# Patient Record
Sex: Female | Born: 1990 | Race: White | Hispanic: No | Marital: Single
Health system: Southern US, Community
[De-identification: ages and names within clinical notes are randomized; demographics above are authoritative.]

---

## 2010-06-23 ENCOUNTER — Observation Stay: Payer: Self-pay | Admitting: Obstetrics & Gynecology

## 2010-07-02 ENCOUNTER — Inpatient Hospital Stay: Payer: Self-pay | Admitting: Obstetrics & Gynecology

## 2013-08-01 ENCOUNTER — Emergency Department: Payer: Self-pay | Admitting: Emergency Medicine

## 2013-08-03 ENCOUNTER — Emergency Department: Payer: Self-pay | Admitting: Emergency Medicine

## 2014-06-26 ENCOUNTER — Ambulatory Visit: Payer: Self-pay | Admitting: Family Medicine

## 2014-08-07 ENCOUNTER — Ambulatory Visit: Payer: Self-pay | Admitting: Family Medicine

## 2014-11-20 ENCOUNTER — Ambulatory Visit: Payer: Self-pay

## 2014-12-04 ENCOUNTER — Ambulatory Visit: Payer: Self-pay | Admitting: Obstetrics & Gynecology

## 2014-12-10 ENCOUNTER — Inpatient Hospital Stay: Payer: Self-pay

## 2014-12-10 LAB — BASIC METABOLIC PANEL
Anion Gap: 6 — ABNORMAL LOW (ref 7–16)
BUN: 10 mg/dL
CO2: 22 mmol/L
Calcium, Total: 8.8 mg/dL — ABNORMAL LOW
Chloride: 106 mmol/L
Creatinine: 0.44 mg/dL
EGFR (Non-African Amer.): >60
Glucose: 85 mg/dL
Potassium: 3.8 mmol/L
Sodium: 134 mmol/L — ABNORMAL LOW

## 2014-12-10 LAB — URIC ACID: Uric Acid: 5.1 mg/dL (ref 2.6–6.0)

## 2014-12-10 LAB — CBC WITH DIFFERENTIAL/PLATELET
BASOS ABS: 0 10*3/uL (ref 0.0–0.1)
Basophil %: 0.2 %
Eosinophil #: 0.1 10*3/uL (ref 0.0–0.7)
Eosinophil %: 0.4 %
HCT: 34.6 % — ABNORMAL LOW (ref 35.0–47.0)
HGB: 10.9 g/dL — ABNORMAL LOW (ref 12.0–16.0)
LYMPHS ABS: 1.7 10*3/uL (ref 1.0–3.6)
Lymphocyte %: 14.8 %
MCH: 25.1 pg — AB (ref 26.0–34.0)
MCHC: 31.6 g/dL — AB (ref 32.0–36.0)
MCV: 80 fL (ref 80–100)
MONO ABS: 0.8 x10 3/mm (ref 0.2–0.9)
Monocyte %: 6.5 %
Neutrophil #: 9 10*3/uL — ABNORMAL HIGH (ref 1.4–6.5)
Neutrophil %: 78.1 %
Platelet: 233 10*3/uL (ref 150–440)
RBC: 4.35 10*6/uL (ref 3.80–5.20)
RDW: 14.1 % (ref 11.5–14.5)
WBC: 11.6 10*3/uL — ABNORMAL HIGH (ref 3.6–11.0)

## 2014-12-10 LAB — PROTEIN / CREATININE RATIO, URINE
Creatinine, Urine: 67 mg/dL (ref 30.0–125.0)
PROTEIN, RANDOM URINE: 101 mg/dL — AB (ref 0–12)
PROTEIN/CREAT. RATIO: 1507 mg/g{creat} — AB (ref 0–200)

## 2014-12-10 LAB — SGOT (AST)(ARMC): SGOT(AST): 26 U/L

## 2014-12-11 LAB — HEMATOCRIT: HCT: 31.3 % — ABNORMAL LOW (ref 35.0–47.0)

## 2014-12-11 LAB — GC/CHLAMYDIA PROBE AMP

## 2015-01-26 NOTE — H&P (Signed)
L&D Evaluation:  History:  HPI 24 year old G2 P1001 with EDC=12/20/2014 by a 14 week [facility] ultrasound who presents to L&D at 38 4/7 weeks with c/o SROM at 0430. Has had contractions all night. No VB. [place] and [facility] 27[facility].PNC remarkable for an elevated one hr GTT and a normal 3hr GTT,  and two negative MRSA cultures this pregnancy. LABS: A POS, RI, VI and GBS negative.   Presents with contractions, leaking fluid   Patient's Medical History MRSA from left breast abscess.   Patient's Surgical History I&D left breast abscess   Medications Pre Natal Vitamins  Tums   Allergies Sulfa   Social History tobacco  quit 04/2014   Family History Non-Contributory    ROS:  ROS All systems were reviewed.  HEENT, CNS, GI, GU, Respiratory, CV, Renal and Musculoskeletal systems were found to be normal.   Exam:  Vital Signs stable    General no apparent distress   Mental Status clear    Chest clear    Heart normal sinus rhythm, no murmur/gallop/rubs   Abdomen gravid, tender with contractions   Estimated Fetal Weight Average for gestational age   Fetal Position cephalic   Reflexes 1+    Pelvic no external lesions, 3/60% per RN exam   Mebranes Ruptured   Description clear   FHT normal rate with no decels   Skin dry   Impression:  Impression IUP at 38.4 weeks with SROM in early labor   Plan:  Plan EFM/NST, monitor contractions and for cervical change, Recheck in a couple hours. Consider Pitocin if no change   Electronic Signatures: Trinna BalloonGutierrez, Dawit Tankard L (CNM)  (Signed 24-Mar-16 07:41)  Authored: L&D Evaluation   Last Updated: 24-Mar-16 07:41 by Trinna BalloonGutierrez, Maecie Sevcik L (CNM)
